# Patient Record
Sex: Male | Born: 1990 | Race: Black or African American | Hispanic: No | Marital: Single | Smoking: Never smoker
Health system: Southern US, Community
[De-identification: ages and names within clinical notes are randomized; demographics above are authoritative.]

---
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
## 2013-07-05 ENCOUNTER — Ambulatory Visit (INDEPENDENT_AMBULATORY_CARE_PROVIDER_SITE_OTHER): Payer: BC Managed Care – PPO | Admitting: Family Medicine

## 2013-07-05 VITALS — BP 130/90 | HR 94 | Temp 98.4°F | Resp 16 | Ht 75.0 in | Wt 198.0 lb

## 2013-07-05 DIAGNOSIS — S8012XA Contusion of left lower leg, initial encounter: Secondary | ICD-10-CM

## 2013-07-05 DIAGNOSIS — S8010XA Contusion of unspecified lower leg, initial encounter: Secondary | ICD-10-CM

## 2013-07-05 DIAGNOSIS — M25569 Pain in unspecified knee: Secondary | ICD-10-CM

## 2013-07-05 DIAGNOSIS — M25562 Pain in left knee: Secondary | ICD-10-CM

## 2013-07-05 DIAGNOSIS — S8002XA Contusion of left knee, initial encounter: Secondary | ICD-10-CM

## 2013-07-05 MED ORDER — MELOXICAM 15 MG PO TABS: 15.0000 mg | ORAL_TABLET | Freq: Every day | ORAL | Status: DC
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
## 2013-07-05 NOTE — Patient Instructions (Signed)
1. Ice knee twice daily for 15-20 minutes.   2.  Avoid exercise for the next week and then increase exercise when knee is pain free. 3.  Take medication as prescribed which is once daily. 4. Return for persistent knee pain or worsening knee pain.

## 2013-09-30 ENCOUNTER — Ambulatory Visit (INDEPENDENT_AMBULATORY_CARE_PROVIDER_SITE_OTHER): Payer: BC Managed Care – PPO | Admitting: Internal Medicine

## 2013-09-30 VITALS — BP 122/72 | HR 89 | Temp 97.9°F | Resp 16 | Ht 75.0 in | Wt 198.4 lb

## 2013-09-30 DIAGNOSIS — M25519 Pain in unspecified shoulder: Secondary | ICD-10-CM

## 2013-09-30 DIAGNOSIS — M25512 Pain in left shoulder: Secondary | ICD-10-CM

## 2013-09-30 MED ORDER — MELOXICAM 15 MG PO TABS: 15.0000 mg | ORAL_TABLET | Freq: Every day | ORAL | Status: DC

## 2013-09-30 NOTE — Progress Notes (Signed)
   Subjective:   This chart was scribed for [name], MD by Jarvis Morgan, Medical Scribe. This patient was seen in Room 3 and the patient's care was started at 4:16 PM.   Patient ID: Danny Ritter, male    DOB: Jan 17, 1990, 23 y.o.   MRN: [id number]  Chief Complaint  Patient presents with  . Shoulder Pain    hurt it while training last Sat    HPI HPI Comments: Danny Ritter is a 23 y.o. male who presents to the [facility] complaining of a left shoulder injury that occurred one week ago. He states that he was wrestling and he fell down onto the left shoulder.  He is having associated constant, sharp, moderate, left shoulder pain. He has been unable to sleep on that side due to pain.The pain is exacerbated by movement. Pt is right handed. Works at [employer] as a Engineer, petroleum. He denies any swelling, color change, numbness or weakness.   There are no active problems to display for this patient.  History reviewed. No pertinent past medical history. History reviewed. No pertinent past surgical history. No Known Allergies Prior to Admission medications   Not on File   History   Social History  . Marital Status: Single    Spouse Name: N/A    Number of Children: N/A  . Years of Education: N/A   Occupational History  . Not on file.   Social History Main Topics  . Smoking status: Never Smoker   . Smokeless tobacco: Not on file  . Alcohol Use: Not on file  . Drug Use: Not on file  . Sexual Activity: Not on file   Other Topics Concern  . Not on file   Social History Narrative  . No narrative on file    Review of Systems A complete 10 system review of systems was obtained and all systems are negative except as noted in the HPI and PMH.      Objective:   Physical Exam  Nursing note and vitals reviewed. Constitutional: He is oriented to person, place, and time. He appears well-developed and well-nourished. No distress.  HENT:  Head: Normocephalic  and atraumatic.  Eyes: Conjunctivae and EOM are normal.  Neck: Neck supple.  Cardiovascular: Normal rate.   Pulmonary/Chest: Effort normal. No respiratory distress.  Musculoskeletal: Normal range of motion. He exhibits tenderness.  Left shoulder- good ROM w/o limitation by pain. Very tender over the [facility] joint. He can resist abduction, non pain.  Neurological: He is alert and oriented to person, place, and time.  Skin: Skin is warm and dry.  Psychiatric: He has a normal mood and affect. His behavior is normal.    Filed Vitals:   09/30/13 1523  BP: 122/72  Pulse: 89  Temp: 97.9 F (36.6 C)  TempSrc: Oral  Resp: 16  Height:  (1.905 m)  Weight: 198 lb 6.4 oz (89.994 kg)  SpO2: 100%        Assessment & Plan:  I personally performed the services described in this documentation, which was scribed in my presence. The recorded information has been reviewed and is accurate.  Acromioclavicular joint strain  Range of motion exercises/meloxicam Followup if work creates pain([employer])

## 2013-12-18 ENCOUNTER — Ambulatory Visit (INDEPENDENT_AMBULATORY_CARE_PROVIDER_SITE_OTHER): Payer: BC Managed Care – PPO

## 2013-12-18 ENCOUNTER — Ambulatory Visit (INDEPENDENT_AMBULATORY_CARE_PROVIDER_SITE_OTHER): Payer: BC Managed Care – PPO | Admitting: Internal Medicine

## 2013-12-18 VITALS — BP 124/86 | HR 84 | Temp 98.1°F | Resp 18 | Ht 75.0 in | Wt 209.4 lb

## 2013-12-18 DIAGNOSIS — M25531 Pain in right wrist: Secondary | ICD-10-CM

## 2013-12-18 NOTE — Progress Notes (Signed)
   Subjective:    Patient ID: Danny Ritter, male    DOB: 12/11/1990, 23 y.o.   MRN: [id number]  HPI  This is a 23 year old male presenting with right wrist pain x 1 day. He reports he injured his wrist while wrestling. 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 He states his arm got caught under his opponent and then he fell on top of his arm. His wrist hyperextended. He has not noticed any swelling or bruising. He notes pain with extension. He has never injured this wrist before. He has not tried anything for pain. He denies paresthesias.  Review of Systems  Constitutional: Negative for fever and chills.  Musculoskeletal: Positive for arthralgias.  Skin: Negative for color change and wound.    There are no active problems to display for this patient.  Prior to Admission medications   Not on File   No Known Allergies  Patient's social and family history were reviewed.     Objective:   Physical Exam  Constitutional: He is oriented to person, place, and time. He appears well-developed and well-nourished. No distress.  HENT:  Head: Normocephalic and atraumatic.  Right Ear: Hearing normal.  Left Ear: Hearing normal.  Nose: Nose normal.  Eyes: Conjunctivae and lids are normal. Right eye exhibits no discharge. Left eye exhibits no discharge. No scleral icterus.  Cardiovascular: Normal rate, regular rhythm, intact distal pulses and normal pulses.   Pulmonary/Chest: Effort normal. No respiratory distress.  Musculoskeletal:       Right elbow: Normal.      Right wrist: He exhibits decreased range of motion (decreased extension) and tenderness (dorsal wrist). He exhibits no swelling.       Right hand: Normal.  No tenderness over radial or ulnar styloid No scaphoid tenderness Decreased strength with finger opposition and wrist extension   Neurological: He is alert and oriented to person, place, and time. No sensory deficit.  Skin: Skin is warm, dry and intact. No lesion and  no rash noted.  Psychiatric: He has a normal mood and affect. His speech is normal and behavior is normal. Thought content normal.   [facility] reading (PRIMARY) by  Dr. Merla Richesoolittle: negative     Assessment & Plan:  1. Right wrist pain Likely due to ligamentous sprain. Pt was counseled on RICE. He was fit for a wrist splint. He will limit activity for next two weeks. If feeling better in two weeks he may return to training. If not, he will return for further evaluation.   - DG Wrist Complete Right; Future   Roswell MinersNicole V. Dyke BrackettBush, PA-C, MHS [facility]  I have participated in the care of this patient with the Advanced Practice Provider and agree with Diagnosis and Plan as documented. Robert P. Merla Richesoolittle, M.D.  12/18/2013

## 2013-12-18 NOTE — Patient Instructions (Signed)
Wear wrist splint and avoid using. Return in 7-14 days for recheck.  RICE: Routine Care for Injuries The routine care of many injuries includes Rest, Ice, Compression, and Elevation (RICE). HOME CARE INSTRUCTIONS  Rest is needed to allow your body to heal. Routine activities can usually be resumed when comfortable. Injured tendons and bones can take up to 6 weeks to heal. Tendons are the cord-like structures that attach muscle to bone.  Ice following an injury helps keep the swelling down and reduces pain.  Put ice in a plastic bag.  Place a towel between your skin and the bag.  Leave the ice on for 15-20 minutes, 3-4 times a day, or as directed by your health care provider. Do this while awake, for the first 24 to 48 hours. After that, continue as directed by your caregiver.  Compression helps keep swelling down. It also gives support and helps with discomfort. If an elastic bandage has been applied, it should be removed and reapplied every 3 to 4 hours. It should not be applied tightly, but firmly enough to keep swelling down. Watch fingers or toes for swelling, bluish discoloration, coldness, numbness, or excessive pain. If any of these problems occur, remove the bandage and reapply loosely. Contact your caregiver if these problems continue.  Elevation helps reduce swelling and decreases pain. With extremities, such as the arms, hands, legs, and feet, the injured area should be placed near or above the level of the heart, if possible. SEEK IMMEDIATE MEDICAL CARE IF:  You have persistent pain and swelling.  You develop redness, numbness, or unexpected weakness.  Your symptoms are getting worse rather than improving after several days. These symptoms may indicate that further evaluation or further X-rays are needed. Sometimes, X-rays may not show a small broken bone (fracture) until 1 week or 10 days later. Make a follow-up appointment with your caregiver. Ask when your X-ray results will  be ready. Make sure you get your X-ray results. Document Released: 04/04/2000 Document Revised: 12/26/2012 Document Reviewed: 05/22/2010 [facility] Patient Information 2015 [employer] [place]. This information is not intended to replace advice given to you by your health care provider. Make sure you discuss any questions you have with your health care provider.

## 2014-06-28 ENCOUNTER — Ambulatory Visit (INDEPENDENT_AMBULATORY_CARE_PROVIDER_SITE_OTHER): Payer: BLUE CROSS/BLUE SHIELD

## 2014-06-28 ENCOUNTER — Ambulatory Visit (INDEPENDENT_AMBULATORY_CARE_PROVIDER_SITE_OTHER): Payer: BLUE CROSS/BLUE SHIELD | Admitting: Family Medicine

## 2014-06-28 VITALS — BP 112/82 | HR 77 | Temp 98.4°F | Resp 16 | Ht 75.0 in | Wt 215.2 lb

## 2014-06-28 DIAGNOSIS — R599 Enlarged lymph nodes, unspecified: Secondary | ICD-10-CM

## 2014-06-28 DIAGNOSIS — R590 Localized enlarged lymph nodes: Secondary | ICD-10-CM | POA: Insufficient documentation

## 2014-06-28 LAB — POCT CBC
Granulocyte percent: 39.4 %G (ref 37–80)
HCT, POC: 45.5 % (ref 43.5–53.7)
HEMOGLOBIN: 14.8 g/dL (ref 14.1–18.1)
Lymph, poc: 2.2 (ref 0.6–3.4)
MCH, POC: 29.5 pg (ref 27–31.2)
MCHC: 32.5 g/dL (ref 31.8–35.4)
MCV: 90.8 fL (ref 80–97)
MID (cbc): 0.2 (ref 0–0.9)
MPV: 7.5 fL (ref 0–99.8)
POC GRANULOCYTE: 1.5 — AB (ref 2–6.9)
POC LYMPH PERCENT: 55.5 %L — AB (ref 10–50)
POC MID %: 5.1 % (ref 0–12)
Platelet Count, POC: 197 10*3/uL (ref 142–424)
RBC: 5.01 M/uL (ref 4.69–6.13)
RDW, POC: 13.4 %
WBC: 3.9 10*3/uL — AB (ref 4.6–10.2)

## 2014-06-28 MED ORDER — AMOXICILLIN-POT CLAVULANATE 875-125 MG PO TABS: 1.0000 | ORAL_TABLET | Freq: Two times a day (BID) | ORAL | Status: DC

## 2014-06-28 NOTE — Patient Instructions (Signed)
Take Augmentin 1 pill twice daily with breakfast and supper for lymph gland  Return if any concerns sooner, otherwise return in 2 weeks for a recheck

## 2014-06-28 NOTE — Progress Notes (Signed)
  Subjective:  Patient ID: Danny Ritter, male    DOB: 04-09-1990  Age: 24 y.o. MRN: [id number]  Generally healthy 24 year old male who noticed a swollen lymph gland above his right clavicle a few days ago. He thinks he just happened to bump his hand across it, no particular pain or other symptoms.  As mentioned he is healthy, works 2 jobs at [employer] and [employer]. He does a lot of physical work outs. No other major symptoms. Has not noted any lymph nodes anywhere else. Does not smoke. Has a regular girlfriend, single partner, no particular HIV risk noted.   Objective:   Throat clear. Neck supple. Right supraclavicular node MID clavicle area 5 cm or so in diameter on palpation. No axillary or inguinal nodes. No hepatosplenomegaly. Chest is clear. Heart regular without murmurs.  Results for orders placed or performed in visit on 06/28/14  POCT CBC  Result Value Ref Range   WBC 3.9 (A) 4.6 - 10.2 K/uL   Lymph, poc 2.2 0.6 - 3.4   POC LYMPH PERCENT 55.5 (A) 10 - 50 %L   MID (cbc) 0.2 0 - 0.9   POC MID % 5.1 0 - 12 %M   POC Granulocyte 1.5 (A) 2 - 6.9   Granulocyte percent 39.4 37 - 80 %G   RBC 5.01 4.69 - 6.13 M/uL   Hemoglobin 14.8 14.1 - 18.1 g/dL   HCT, POC 40.9 81.1 - 53.7 %   MCV 90.8 80 - 97 fL   MCH, POC 29.5 27 - 31.2 pg   MCHC 32.5 31.8 - 35.4 g/dL   RDW, POC 91.4 %   Platelet Count, POC 197 142 - 424 K/uL   MPV 7.5 0 - 99.8 fL   [facility] reading (PRIMARY) by  Dr. Alwyn Ren Normal chest  . Moderate risk level   Assessment & Plan:   Assessment: Supraclavicular lymphadenopathy, etiology undetermined  Plan: Chest x-ray and CBC and HIV test  Will treat with antibodies. Advised him that it is fairly important in a lymph gland like this before and up onto make sure it is going away. May need a scan if it persists.  Augmentin 875 twice daily.   Patient Instructions  Take Augmentin 1 pill twice daily with breakfast and supper for lymph gland  Return if any concerns sooner,  otherwise return in 2 weeks for a recheck     Sherryn Pollino, MD 06/28/2014

## 2014-06-29 LAB — HIV ANTIBODY (ROUTINE TESTING W REFLEX): HIV: NONREACTIVE

## 2014-07-09 ENCOUNTER — Ambulatory Visit (INDEPENDENT_AMBULATORY_CARE_PROVIDER_SITE_OTHER): Payer: BLUE CROSS/BLUE SHIELD | Admitting: Family Medicine

## 2014-07-09 VITALS — BP 124/90 | HR 76 | Temp 98.8°F | Resp 15 | Ht 75.0 in | Wt 215.0 lb

## 2014-07-09 DIAGNOSIS — R599 Enlarged lymph nodes, unspecified: Secondary | ICD-10-CM

## 2014-07-09 DIAGNOSIS — R590 Localized enlarged lymph nodes: Secondary | ICD-10-CM

## 2014-07-09 LAB — POCT CBC
Granulocyte percent: 41.6 %G (ref 37–80)
HCT, POC: 48.1 % (ref 43.5–53.7)
HEMOGLOBIN: 15.6 g/dL (ref 14.1–18.1)
Lymph, poc: 2 (ref 0.6–3.4)
MCH: 29.8 pg (ref 27–31.2)
MCHC: 32.5 g/dL (ref 31.8–35.4)
MCV: 91.7 fL (ref 80–97)
MID (CBC): 0.2 (ref 0–0.9)
MPV: 8.1 fL (ref 0–99.8)
PLATELET COUNT, POC: 215 10*3/uL (ref 142–424)
POC GRANULOCYTE: 1.5 — AB (ref 2–6.9)
POC LYMPH PERCENT: 53 %L — AB (ref 10–50)
POC MID %: 5.4 %M (ref 0–12)
RBC: 5.24 M/uL (ref 4.69–6.13)
RDW, POC: 13.7 %
WBC: 3.7 10*3/uL — AB (ref 4.6–10.2)

## 2014-07-09 NOTE — Progress Notes (Signed)
  Subjective:  Patient ID: Danny Ritter, male    DOB: 10/14/1990  Age: 24 y.o. MRN: [id number]  24 year old male who is here with the swollen gland in his right side of his neck that I checked a couple of weeks ago. He took the course of Augmentin. It is not changed. He has no other major symptoms. No night sweats. No itching. No fevers. Hasn't noticed swelling anywhere else. Objective:   Right supraclavicular area still has a significantly enlarged node, palpable about 4 cm   Results for orders placed or performed in visit on 07/09/14  POCT CBC  Result Value Ref Range   WBC 3.7 (A) 4.6 - 10.2 K/uL   Lymph, poc 2.0 0.6 - 3.4   POC LYMPH PERCENT 53.0 (A) 10 - 50 %L   MID (cbc) 0.2 0 - 0.9   POC MID % 5.4 0 - 12 %M   POC Granulocyte 1.5 (A) 2 - 6.9   Granulocyte percent 41.6 37 - 80 %G   RBC 5.24 4.69 - 6.13 M/uL   Hemoglobin 15.6 14.1 - 18.1 g/dL   HCT, POC 53.648.1 64.443.5 - 53.7 %   MCV 91.7 80 - 97 fL   MCH, POC 29.8 27 - 31.2 pg   MCHC 32.5 31.8 - 35.4 g/dL   RDW, POC 03.413.7 %   Platelet Count, POC 215 142 - 424 K/uL   MPV 8.1 0 - 99.8 fL      Assessment & Plan:   Assessment: Right supraclavicular node--need to rule out things like lymphoma  Plan: Spoke to radiologist who recommended CT scan with contrast Patient Instructions  No new treatment.  We will let you know in the next day or so when the CT scan is scheduled for.     HOPPER,DAVID, MD 07/09/2014

## 2014-07-09 NOTE — Patient Instructions (Addendum)
No new treatment.  We will let you know in the next day or so when the CT scan is scheduled for.

## 2014-07-16 ENCOUNTER — Other Ambulatory Visit: Payer: Medicaid Other

## 2014-07-17 ENCOUNTER — Ambulatory Visit
Admission: RE | Admit: 2014-07-17 | Discharge: 2014-07-17 | Disposition: A | Discharge status: Home / Self Care | Payer: Medicaid Other | Source: Ambulatory Visit | Attending: Family Medicine | Admitting: Family Medicine

## 2014-07-17 DIAGNOSIS — R590 Localized enlarged lymph nodes: Secondary | ICD-10-CM

## 2014-07-17 MED ORDER — IOPAMIDOL (ISOVUE-300) INJECTION 61%
75.0000 mL | Freq: Once | INTRAVENOUS | Status: AC | PRN
  Administered 2014-07-17: 75 mL via INTRAVENOUS

## 2014-10-13 ENCOUNTER — Ambulatory Visit (INDEPENDENT_AMBULATORY_CARE_PROVIDER_SITE_OTHER): Payer: BLUE CROSS/BLUE SHIELD | Admitting: Family Medicine

## 2014-10-13 VITALS — BP 120/80 | HR 82 | Temp 98.6°F | Resp 16 | Ht 75.0 in | Wt 219.0 lb

## 2014-10-13 DIAGNOSIS — K1120 Sialoadenitis, unspecified: Secondary | ICD-10-CM | POA: Diagnosis not present

## 2014-10-13 MED ORDER — PENICILLIN V POTASSIUM 500 MG PO TABS: 500.0000 mg | ORAL_TABLET | Freq: Three times a day (TID) | ORAL | Status: AC

## 2014-10-13 NOTE — Patient Instructions (Signed)
Salivary Stone A salivary stone is a mineral deposit that builds up in the ducts that drain your salivary glands. Most salivary gland stones are made of calcium. When a stone forms, saliva can back up into the gland and cause painful swelling. Your salivary glands are the glands that produce spit (saliva). You have six major salivary glands. Each gland has a duct that carries saliva into your mouth. Saliva keeps your mouth moist and breaks down the food that you eat. It also helps to prevent tooth decay. Two salivary glands are located just in front of your ears (parotid). The ducts for these glands open up inside your cheeks, near your back teeth. You also have two glands under your tongue (sublingual) and two glands under your jaw (submandibular). The ducts for these glands open under your tongue. A stone can form in any salivary gland. The most common place for a salivary stone to develop is in a submandibular salivary gland. CAUSES Any condition that reduces the flow of saliva may lead to stone formation. It is not known why some people form stones and others do not.  RISK FACTORS You may be more likely to develop a salivary stone if you:  Are male.  Do not drink enough water.  Smoke.  Have high blood pressure.  Have gout.  Have diabetes. SIGNS AND SYMPTOMS The main sign of a salivary gland stone is sudden swelling of a salivary gland when eating. This usually happens under the jaw on one side. Other signs and symptoms include:  Swelling of the cheek or under the tongue when eating.  Pain in the swollen area.  Trouble chewing or swallowing.  Swelling that goes down after eating. DIAGNOSIS Your health care provider may diagnose a salivary gland stone based on your signs and symptoms. The health care provider will also do a physical exam. In many cases, a stone can be felt in a duct inside your mouth. You may need to see an ear, nose, and throat specialist (ENT or otolaryngologist)  for diagnosis and treatment. You may also need to have diagnostic tests. These may include imaging studies to check for a stone, such as:  X-rays.  Ultrasound.  CT scan.  MRI. TREATMENT Home care may be enough to treat a small stone that is not causing symptoms. Treatment of a stone that is large enough to cause symptoms may include:  Probing and widening the duct to allow the stone to pass.  Inserting a thin, flexible scope (endoscope) into the duct to locate and remove the stone.  Breaking up the stone with sound waves.  Removing the entire salivary gland. HOME CARE INSTRUCTIONS  Drink enough fluid to keep your urine clear or pale yellow.  Follow these instructions every few hours:  Suck on a lemon candy to stimulate the flow of saliva.  Put a hot compress over the gland.  Gently massage the gland.  Do not use any tobacco products, including cigarettes, chewing tobacco, or electronic cigarettes. If you need help quitting, ask your health care provider. SEEK MEDICAL CARE IF:  You have pain and swelling in your face, jaw, or mouth after eating.  You have persistent swelling in any of these places:  In front of your ear.  Under your jaw.  Inside your mouth. SEEK IMMEDIATE MEDICAL CARE IF:  You have pain and swelling in your face, jaw, or mouth that are getting worse.  Your pain and swelling make it hard to swallow or breathe.     This information is not intended to replace advice given to you by your health care provider. Make sure you discuss any questions you have with your health care provider.   Document Released: 01/29/2004 Document Revised: 01/11/2014 Document Reviewed: 05/23/2013 Elsevier Interactive Patient Education 2016 [employer].  

## 2014-10-13 NOTE — Progress Notes (Signed)
 @  This chart was scribed for Danny Sidle, MD by Danny Ritter, ED Scribe. This patient was seen in room 2 and the patient's care was started at 11:59 AM.  Patient ID: Danny Ritter MRN: [id number], DOB: 1990-04-16, 24 y.o. Date of Encounter: 10/13/2014, 11:59 AM  Primary Physician: No PCP Per Patient  Chief Complaint:  Chief Complaint  Patient presents with  . Jaw Pain    x 2 days     HPI: 24 y.o. year old male with history below presents with jaw pain that began 2 days ago. Pt was eating pizza  When he developed sudden upper jaw pain 2 days ago. He has worsening pain when eat foods that are sour and sweet. He has tried applying heat as well without relief.   Pt works at [employer].   History reviewed. No pertinent past medical history.   Home Meds: Prior to Admission medications   Not on File    Allergies: No Known Allergies  Social History   Social History  . Marital Status: Single    Spouse Name: N/A  . Number of Children: N/A  . Years of Education: N/A   Occupational History  . Not on file.   Social History Main Topics  . Smoking status: Never Smoker   . Smokeless tobacco: Never Used  . Alcohol Use: No  . Drug Use: No  . Sexual Activity: Not on file   Other Topics Concern  . Not on file   Social History Narrative     Review of Systems: Constitutional: negative for chills, fever, night sweats, weight changes, or fatigue  HEENT: negative for vision changes, hearing loss, congestion, rhinorrhea, ST, epistaxis, or sinus pressure Cardiovascular: negative for chest pain or palpitations Respiratory: negative for hemoptysis, wheezing, shortness of breath, or cough Abdominal: negative for abdominal pain, nausea, vomiting, diarrhea, or constipation Dermatological: negative for rash Neurologic: negative for headache, dizziness, or syncope All other systems reviewed and are otherwise negative with the exception to those above and in the  HPI.   Physical Exam: Blood pressure 120/80, pulse 82, temperature 98.6 F (37 C), temperature source Oral, resp. rate 16, height  (1.905 m), weight 219 lb (99.338 kg), SpO2 98 %., Body mass index is 27.37 kg/(m^2). General: Well developed, well nourished, in no acute distress. Head: Normocephalic, atraumatic, eyes without discharge, sclera non-icteric, nares are without discharge. Bilateral auditory canals clear, TM's are without perforation, pearly grey and translucent with reflective cone of light bilaterally. Oral cavity moist, posterior pharynx without exudate,  peritonsillar abscess, or post nasal drip. There is a palpable induration at the opening of Stensen's duct on the left. There is mild erythema in the adjacent buccal mucosa Neck: Supple. No thyromegaly. Full ROM. No lymphadenopathy. Lungs: Clear bilaterally to auscultation without wheezes, rales, or rhonchi. Breathing is unlabored. Heart: RRR with S1 S2. No murmurs, rubs, or gallops appreciated. Abdomen: Soft, non-tender, non-distended with normoactive bowel sounds. No hepatomegaly. No rebound/guarding. No obvious abdominal masses. Msk:  Strength and tone normal for age. Extremities/Skin: Warm and dry. No clubbing or cyanosis. No edema. No rashes or suspicious lesions. Neuro: Alert and oriented X 3. Moves all extremities spontaneously. Gait is normal. CNII-XII grossly in tact. Psych:  Responds to questions appropriately with a normal affect.    ASSESSMENT AND PLAN:  25 y.o. year old male with acute parotid stone with adjacent swelling    ICD-9-CM ICD-10-CM   1. Sialoadenitis 527.2 K11.20 penicillin v potassium (VEETID) 500 MG tablet  By signing my name below, I, Raven Small, attest that this documentation has been prepared under the direction and in the presence of Danny Sidle, MD.  Electronically Signed: Andrew Ritter, ED Scribe. 10/13/2014. 12:18 PM.  Signed, Danny Sidle, MD 10/13/2014 11:59 AM

## 2016-06-25 IMAGING — CR DG WRIST COMPLETE 3+V*R*
2 series · 2 of 2 positions shown · non-contrast
Comparison: None.

CLINICAL DATA: Generalized pain

EXAM:
RIGHT WRIST - COMPLETE 3+ VIEW

[PA]
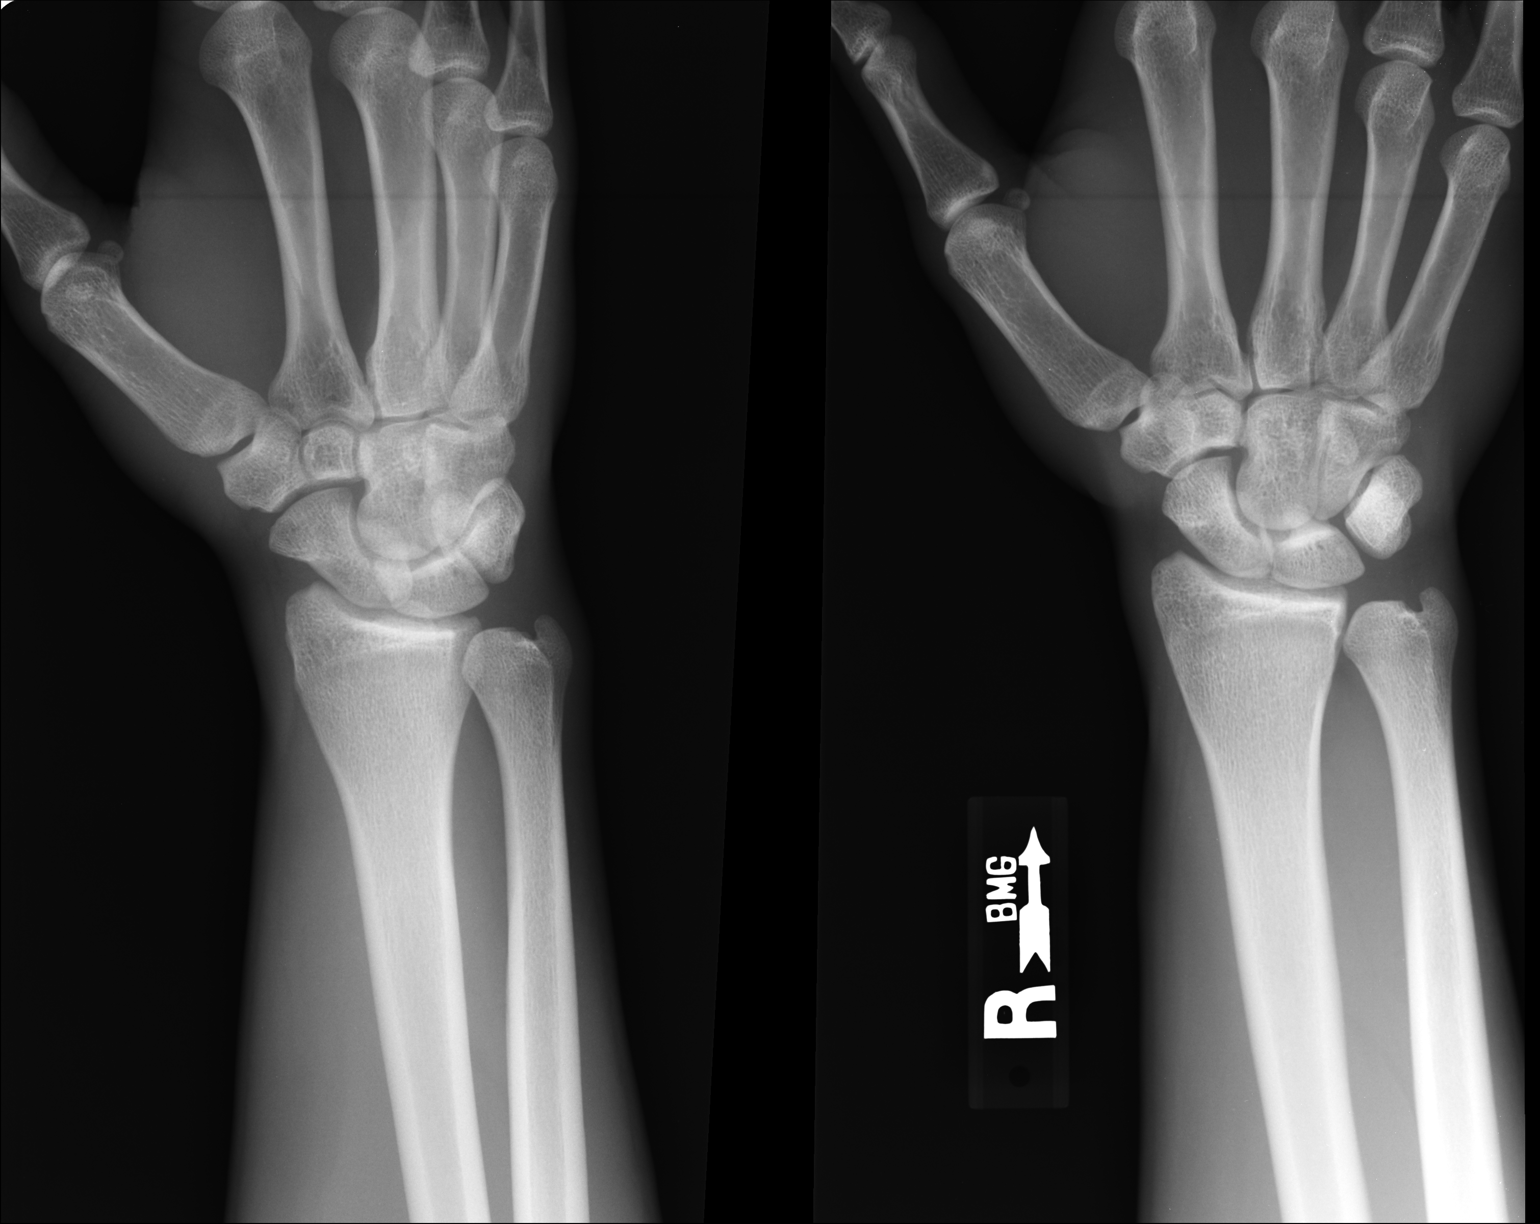

[lateral]
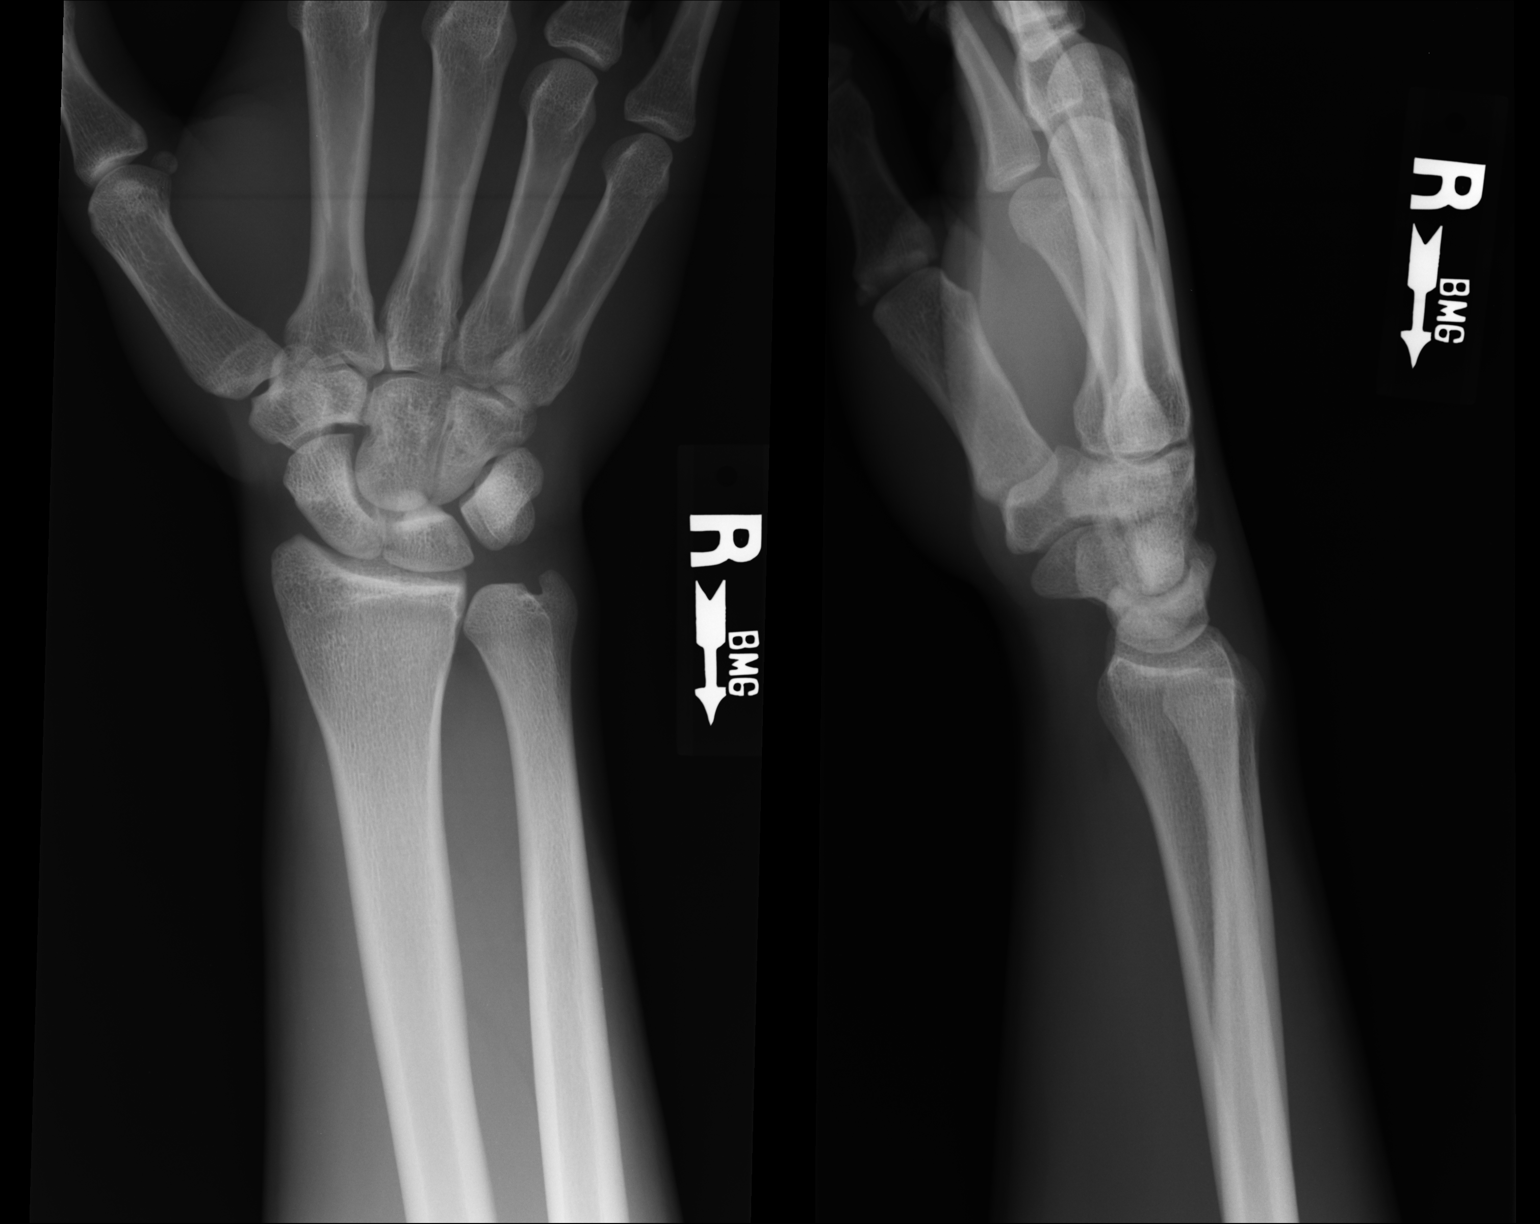

[2 of 2 positions shown; findings below may reference images not displayed]

FINDINGS: Frontal, oblique, lateral, and ulnar deviation scaphoid images were
obtained. There is no fracture or dislocation. Joint spaces appear
intact. No erosive change.
IMPRESSION: No fracture or dislocation.  No appreciable arthropathic change.
